# Patient Record
Sex: Male | Born: 1975 | Race: White | Hispanic: No | Marital: Single | Smoking: Current every day smoker
Health system: Southern US, Community
[De-identification: ages and names within clinical notes are randomized; demographics above are authoritative.]

## PROBLEM LIST (undated history)

## (undated) DIAGNOSIS — E119 Type 2 diabetes mellitus without complications: Secondary | ICD-10-CM

## (undated) DIAGNOSIS — R011 Cardiac murmur, unspecified: Secondary | ICD-10-CM

## (undated) DIAGNOSIS — F411 Generalized anxiety disorder: Secondary | ICD-10-CM

## (undated) DIAGNOSIS — I10 Essential (primary) hypertension: Secondary | ICD-10-CM

---

## 2017-09-25 ENCOUNTER — Encounter (HOSPITAL_COMMUNITY): Payer: Self-pay | Admitting: *Deleted

## 2017-09-25 ENCOUNTER — Ambulatory Visit (HOSPITAL_COMMUNITY)
Admission: EM | Admit: 2017-09-25 | Discharge: 2017-09-25 | Disposition: A | Discharge status: Home / Self Care | Payer: Self-pay | Attending: Internal Medicine | Admitting: Internal Medicine

## 2017-09-25 ENCOUNTER — Other Ambulatory Visit: Payer: Self-pay

## 2017-09-25 ENCOUNTER — Ambulatory Visit (INDEPENDENT_AMBULATORY_CARE_PROVIDER_SITE_OTHER): Payer: Self-pay

## 2017-09-25 DIAGNOSIS — S92355A Nondisplaced fracture of fifth metatarsal bone, left foot, initial encounter for closed fracture: Secondary | ICD-10-CM

## 2017-09-25 HISTORY — DX: Type 2 diabetes mellitus without complications: E11.9

## 2017-09-25 HISTORY — DX: Essential (primary) hypertension: I10

## 2017-09-25 NOTE — Discharge Instructions (Signed)
Activity as tolerated, use of post op shoe or firm soled shoe. Ice, elevation, ibuprofen for pain control. Follow up with a primary care provider or orthopedics for recheck in approximately 2-4 weeks.

## 2017-09-25 NOTE — ED Triage Notes (Signed)
Reports tripping and falling yesterday, causing injury to left dorsal foot.  C/O ecchymosis to area.  Ambulates with limp.  Denies pain at rest, only with ambulation.

## 2017-09-25 NOTE — ED Provider Notes (Signed)
[place]    CSN: [id number] Arrival date & time: 09/25/17  1537     History   Chief Complaint Chief Complaint  Patient presents with  . Foot Injury    HPI Bayard More is a 41 y.o. male.   Darryl Levine presents with complaints of lateral left foot pain which developed after he stepped down on the foot wrong this morning. Denies previous left foot injury. Took 819m ibuprofen this morning which helped. Pain is worse with weight bearing. Denies loss of sensation. No open skin or lacerations. Bruising present.    ROS per HPI.       Past Medical History:  Diagnosis Date  . Diabetes mellitus without complication ([place]    has been without metformin "for a while"  . Hypertension     There are no active problems to display for this patient.   History reviewed. No pertinent surgical history.     Home Medications    Prior to Admission medications   Not on File    Family History No family history on file.  Social History Social History   Tobacco Use  . Smoking status: Current Every Day Smoker  Substance Use Topics  . Alcohol use: Yes    Alcohol/week: 7.2 oz    Types: 12 Cans of beer per week  . Drug use: No     Allergies   Patient has no known allergies.   Review of Systems Review of Systems   Physical Exam Triage Vital Signs ED Triage Vitals [09/25/17 1625]  Enc Vitals Group     BP (!) 158/93     Pulse Rate 99     Resp 16     Temp 98.1 F (36.7 C)     Temp Source Oral     SpO2 100 %     Weight      Height      Head Circumference      Peak Flow      Pain Score      Pain Loc      Pain Edu?      Excl. in [place]    No data found.  Updated Vital Signs BP (!) 158/93   Pulse 99   Temp 98.1 F (36.7 C) (Oral)   Resp 16   SpO2 100%   Visual Acuity Right Eye Distance:   Left Eye Distance:   Bilateral Distance:    Right Eye Near:   Left Eye Near:    Bilateral Near:     Physical Exam  Constitutional: He is oriented to  person, place, and time. He appears well-developed and well-nourished.  Cardiovascular: Normal rate and regular rhythm.  Pulmonary/Chest: Effort normal and breath sounds normal.  Musculoskeletal:       Left foot: There is tenderness, bony tenderness and swelling. There is normal range of motion, normal capillary refill, no crepitus, no deformity and no laceration.       Feet:  Bruising and redness noted; tenderness worse to 5 metatarsal, mild tenderness to 4th metatarsal; sensation intact; full active ankle and toe ROM  Neurological: He is alert and oriented to person, place, and time.  Skin: Skin is warm and dry.     UC Treatments / Results  Labs (all labs ordered are listed, but only abnormal results are displayed) Labs Reviewed - No data to display  EKG  EKG Interpretation None       Radiology Dg Foot Complete Left  Result Date: 09/25/2017 CLINICAL DATA:  41 year old male with left foot pain, swelling and redness after oral lying foot this morning. EXAM: LEFT FOOT - COMPLETE 3+ VIEW COMPARISON:  None. FINDINGS: Nondisplaced oblique fractures involving of the midshaft of the fifth metatarsal. Remaining osseous structures appear intact there is minimal overlying soft tissue swelling. IMPRESSION: Nondisplaced oblique fractures involving the midshaft of the fifth metatarsal. Electronically Signed   By: Kristopher Oppenheim M.D.   On: 09/25/2017 18:20    Procedures Procedures (including critical care time)  Medications Ordered in UC Medications - No data to display   Initial Impression / Assessment and Plan / UC Course  I have reviewed the triage vital signs and the nursing notes.  Pertinent labs & imaging results that were available during my care of the patient were reviewed by me and considered in my medical decision making (see chart for details).     5th metatarsal non displaced shaft fractures. Post op shoe provided. 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 Recommended off for  the next approximately 5 days, may return sooner if tolerable. To follow up with orthopedics or primary care for recheck. RICE, ibuprofen for pain control. Patient verbalized understanding and agreeable to plan. Ambulatory out of clinic without difficulty.    Final Clinical Impressions(s) / UC Diagnoses   Final diagnoses:  Closed nondisplaced fracture of fifth metatarsal bone of left foot, initial encounter    ED Discharge Orders    None       Controlled Substance Prescriptions [place] Controlled Substance Registry consulted? Not Applicable   Zigmund Gottron, NP 09/25/17 [phone]

## 2017-12-18 ENCOUNTER — Ambulatory Visit (HOSPITAL_COMMUNITY)
Admission: EM | Admit: 2017-12-18 | Discharge: 2017-12-18 | Disposition: A | Discharge status: Home / Self Care | Payer: Self-pay | Attending: Family Medicine | Admitting: Family Medicine

## 2017-12-18 ENCOUNTER — Encounter (HOSPITAL_COMMUNITY): Payer: Self-pay | Admitting: Family Medicine

## 2017-12-18 DIAGNOSIS — J209 Acute bronchitis, unspecified: Secondary | ICD-10-CM

## 2017-12-18 DIAGNOSIS — H1132 Conjunctival hemorrhage, left eye: Secondary | ICD-10-CM

## 2017-12-18 MED ORDER — PREDNISONE 5 MG PO TABS: ORAL_TABLET | ORAL | 0 refills | Status: DC

## 2017-12-18 MED ORDER — BENZONATATE 100 MG PO CAPS: 200.0000 mg | ORAL_CAPSULE | Freq: Three times a day (TID) | ORAL | 0 refills | Status: DC | PRN

## 2017-12-18 MED ORDER — AZITHROMYCIN 250 MG PO TABS: 250.0000 mg | ORAL_TABLET | Freq: Every day | ORAL | 0 refills | Status: DC

## 2017-12-18 NOTE — ED Triage Notes (Addendum)
Pt here for bleeding in left eye noticed this am. denies pain or trouble with vision.

## 2017-12-18 NOTE — ED Provider Notes (Signed)
[name]   [id number] 12/18/17 Arrival Time: 1226  ASSESSMENT & PLAN:  1. Subconjunctival hemorrhage of left eye   2. Acute bronchitis, unspecified organism     Meds ordered this encounter  Medications  . azithromycin (ZITHROMAX) 250 MG tablet    Sig: Take 1 tablet (250 mg total) by mouth daily. Take first 2 tablets together, then 1 every day until finished.    Dispense:  6 tablet    Refill:  0    Order Specific Question:   Supervising Provider    Answer:   Vanessa Kick [id number]  . benzonatate (TESSALON) 100 MG capsule    Sig: Take 2 capsules (200 mg total) by mouth 3 (three) times daily as needed for cough.    Dispense:  21 capsule    Refill:  0    Order Specific Question:   Supervising Provider    Answer:   Vanessa Kick [id number]  . predniSONE (DELTASONE) 5 MG tablet    Sig: Take 6-5-4-3-2-1 po qd    Dispense:  21 tablet    Refill:  0    Order Specific Question:   Supervising Provider    Answer:   Vanessa Kick [[id number]]    Reviewed expectations re: course of current medical issues. Questions answered. Outlined signs and symptoms indicating need for more acute intervention. Patient verbalized understanding. After Visit Summary given.   SUBJECTIVE: History from: patient. Darryl Levine is a 42 y.o. male who presents with complaint of intermittent left eye redness and cough. Reports abrupt onset today. Described symptoms have gradually worsened since beginning.  ROS: As per HPI.   OBJECTIVE:  Vitals:   12/18/17 1343  BP: (!) 141/83  Pulse: (!) 102  Resp: 18  Temp: 98.1 F (36.7 C)  SpO2: 98%    General appearance: alert; no distress Eyes: PERRLA; EOMI; conjunctiva left eye with subconjunctiva hemorrage, right wnl HENT: normocephalic; atraumatic; TMs normal; nasal mucosa normal; oral mucosa normal Neck: supple  Lungs: clear to auscultation bilaterally Heart: regular rate and rhythm Abdomen: soft, non-tender; bowel sounds normal; no masses  or organomegaly; no guarding or rebound tenderness Back: no CVA tenderness Extremities: no cyanosis or edema; symmetrical with no gross deformities Skin: warm and dry Neurologic: normal gait; normal symmetric reflexes Psychological: alert and cooperative; normal mood and affect  Labs: No results found for this or any previous visit. Labs Reviewed - No data to display  Imaging: No results found.  No Known Allergies  Past Medical History:  Diagnosis Date  . Diabetes mellitus without complication ([place])    has been without metformin "for a while"  . Hypertension    Social History   Socioeconomic History  . Marital status: Single    Spouse name: Not on file  . Number of children: Not on file  . Years of education: Not on file  . Highest education level: Not on file  Social Needs  . Financial resource strain: Not on file  . Food insecurity - worry: Not on file  . Food insecurity - inability: Not on file  . Transportation needs - medical: Not on file  . Transportation needs - non-medical: Not on file  Occupational History  . Not on file  Tobacco Use  . Smoking status: Current Every Day Smoker  Substance and Sexual Activity  . Alcohol use: Yes    Alcohol/week: 7.2 oz    Types: 12 Cans of beer per week  . Drug use: No  . Sexual activity: Not on  file  Other Topics Concern  . Not on file  Social History Narrative  . Not on file   History reviewed. No pertinent family history. History reviewed. No pertinent surgical history.   Lysbeth Penner, [place] 12/18/17 2059

## 2018-03-02 ENCOUNTER — Other Ambulatory Visit: Payer: Self-pay

## 2018-03-02 ENCOUNTER — Encounter (HOSPITAL_COMMUNITY): Payer: Self-pay | Admitting: Emergency Medicine

## 2018-03-02 ENCOUNTER — Ambulatory Visit (HOSPITAL_COMMUNITY)
Admission: EM | Admit: 2018-03-02 | Discharge: 2018-03-02 | Disposition: A | Discharge status: Home / Self Care | Payer: Self-pay

## 2018-03-02 DIAGNOSIS — I10 Essential (primary) hypertension: Secondary | ICD-10-CM

## 2018-03-02 DIAGNOSIS — M545 Low back pain, unspecified: Secondary | ICD-10-CM

## 2018-03-02 DIAGNOSIS — M62830 Muscle spasm of back: Secondary | ICD-10-CM

## 2018-03-02 HISTORY — DX: Cardiac murmur, unspecified: R01.1

## 2018-03-02 MED ORDER — NAPROXEN 375 MG PO TABS: 375.0000 mg | ORAL_TABLET | Freq: Two times a day (BID) | ORAL | 0 refills | Status: DC

## 2018-03-02 NOTE — Discharge Instructions (Addendum)
Rest, ice, heat and gentle stretches Use naproxen as needed for symptomatic relief.  Discontinue ibuprofen, advil, and/or aleve while taking naproxen Work note given Follow up with PCP if symptoms persists Return or go to ER if you have any new or worsening symptoms  PCP assistance requested for further evaluation and management of hypertension, diabetes and heart murmur

## 2018-03-02 NOTE — ED Provider Notes (Signed)
[facility]    CSN: [id number] Arrival date & time: 03/02/18  1224     History   Chief Complaint Chief Complaint  Patient presents with  . Back Pain    HPI Darryl Levine is a 42 y.o. male.   Complains of back pain that began 1 days ago.  It started after lifting a couch.  He localizes the pain to the low back.  He describes the pain as intermittent and achy in character.  He has tried ibuprofen with temporary relief.  His symptoms are made worse with getting up and down.  He reports similar symptoms in the past that resolved with rest.       Past Medical History:  Diagnosis Date  . Diabetes mellitus without complication ([place])    has been without metformin "for a while"  . Hypertension   . Murmur, heart     There are no active problems to display for this patient.   History reviewed. No pertinent surgical history.     Home Medications    Prior to Admission medications   Medication Sig Start Date End Date Taking? Authorizing Provider  ibuprofen (ADVIL,MOTRIN) 200 MG tablet Take 200 mg by mouth every 6 (six) hours as needed.   Yes [provider]  azithromycin (ZITHROMAX) 250 MG tablet Take 1 tablet (250 mg total) by mouth daily. Take first 2 tablets together, then 1 every day until finished. 12/18/17   Lysbeth Penner, FNP  benzonatate (TESSALON) 100 MG capsule Take 2 capsules (200 mg total) by mouth 3 (three) times daily as needed for cough. 12/18/17   Lysbeth Penner, FNP  naproxen (NAPROSYN) 375 MG tablet Take 1 tablet (375 mg total) by mouth 2 (two) times daily. 03/02/18   Macayla Ekdahl, [place], PA-C  predniSONE (DELTASONE) 5 MG tablet Take 6-5-4-3-2-1 po qd 12/18/17   Lysbeth Penner, FNP    Family History Family History  Problem Relation Age of Onset  . Diabetes Mother   . Diabetes Father     Social History Social History   Tobacco Use  . Smoking status: Current Every Day Smoker  Substance Use Topics  . Alcohol use: Yes   Alcohol/week: 7.2 oz    Types: 12 Cans of beer per week  . Drug use: No     Allergies   Patient has no known allergies.   Review of Systems Review of Systems  Constitutional: Negative for chills and fever.  Respiratory: Negative for shortness of breath.   Cardiovascular: Negative for chest pain.  Gastrointestinal: Negative for abdominal pain, nausea and vomiting.  Genitourinary:       Denies changes in bowel or bladder habits or incontinence  Neurological: Negative for weakness and numbness.     Physical Exam Triage Vital Signs ED Triage Vitals  Enc Vitals Group     BP 03/02/18 1313 (!) 151/90     Pulse Rate 03/02/18 1313 (!) 112     Resp 03/02/18 1313 18     Temp 03/02/18 1313 98.7 F (37.1 C)     Temp Source 03/02/18 1313 Oral     SpO2 03/02/18 1313 99 %     Weight --      Height --      Head Circumference --      Peak Flow --      Pain Score 03/02/18 1310 7     Pain Loc --      Pain Edu? --      Excl.  in [place]? --    No data found.  Updated Vital Signs BP (!) 151/90 (BP Location: Left Arm)   Pulse (!) 112   Temp 98.7 F (37.1 C) (Oral)   Resp 18   SpO2 99%    Physical Exam  Constitutional: He is oriented to person, place, and time. He appears well-developed and well-nourished. No distress.  HENT:  Head: Normocephalic and atraumatic.  Right Ear: External ear normal.  Left Ear: External ear normal.  Nose: Nose normal.  Mouth/Throat: Oropharynx is clear and moist. No oropharyngeal exudate.  Eyes: Pupils are equal, round, and reactive to light. EOM are normal.  Neck: Normal range of motion. Neck supple.  Cardiovascular: Regular rhythm. Exam reveals no gallop and no friction rub.  Murmur (Grade 2-3 systolic murmur appreciated in the aortic region) heard. Radial pulse 2+ bilaterally    Pulmonary/Chest: Effort normal and breath sounds normal. No stridor. No respiratory distress. He has no wheezes. He has no rales.  Abdominal: Soft. Bowel sounds are normal.  There is no tenderness. There is no guarding.  Musculoskeletal:  Back:  Patient ambulates from chair to exam table without difficulty.  Inspection: Skin clear and intact without obvious swelling, erythema, or ecchymosis. Warm to the touch  Palpation: Vertebral processes nontender. Tenderness about the lower left and right paravertebral muscles  ROM: FROM Strength: 5/5 hip flexion, 5/5 knee extension, 5/5 knee flexion, 5/5 plantar flexion, 5/5 dorsiflexion  DTR: Patellar tendon reflex intact  Special Tests: Negative Straight leg raise  Lymphadenopathy:    He has no cervical adenopathy.  Neurological: He is alert and oriented to person, place, and time.  Skin: Skin is warm. Capillary refill takes less than 2 seconds.  Hands clammy, but patient reports he "get(s) nervous at the doctor's office."  Psychiatric: Judgment and thought content normal.  Appears anxious and nervous during examination     UC Treatments / Results  Labs (all labs ordered are listed, but only abnormal results are displayed) Labs Reviewed - No data to display  EKG None  Radiology No results found.  Procedures Procedures (including critical care time)  Medications Ordered in UC Medications - No data to display  Initial Impression / Assessment and Plan / UC Course  I have reviewed the triage vital signs and the nursing notes.  Pertinent labs & imaging results that were available during my care of the patient were reviewed by me and considered in my medical decision making (see chart for details).     Patient complains of low back pain.  PE consistent with back spasm.  Prescribed naproxen.  Instructed patient to rest, ice, heat and gentle stretches.  Will return or follow up with PCP if symptoms persists.  New or worsening symptoms will return or go to ER.   Final Clinical Impressions(s) / UC Diagnoses   Final diagnoses:  Acute bilateral low back pain without sciatica  Spasm of muscle of lower back    Benign essential HTN     Discharge Instructions     Rest, ice, heat and gentle stretches Use naproxen as needed for symptomatic relief.  Discontinue ibuprofen, advil, and/or aleve while taking naproxen Work note given Follow up with PCP if symptoms persists Return or go to ER if you have any new or worsening symptoms  PCP assistance requested for further evaluation and management of hypertension, diabetes and heart murmur    ED Prescriptions    Medication Sig Dispense Auth. Provider   naproxen (NAPROSYN) 375 MG tablet  Take 1 tablet (375 mg total) by mouth 2 (two) times daily. 20 tablet Stacey Drain, [place], PA-C     Controlled Substance Prescriptions [place] Controlled Substance Registry consulted? Not Applicable   Lestine Box, [place] 03/02/18 1357

## 2018-03-02 NOTE — ED Triage Notes (Signed)
Lower back pain since moving a couch yesterday.    "I need a note for work today"

## 2018-04-21 ENCOUNTER — Other Ambulatory Visit: Payer: Self-pay

## 2018-04-21 ENCOUNTER — Encounter (HOSPITAL_COMMUNITY): Payer: Self-pay | Admitting: Emergency Medicine

## 2018-04-21 ENCOUNTER — Ambulatory Visit (HOSPITAL_COMMUNITY)
Admission: EM | Admit: 2018-04-21 | Discharge: 2018-04-21 | Disposition: A | Discharge status: Home / Self Care | Payer: Self-pay | Attending: Nurse Practitioner | Admitting: Nurse Practitioner

## 2018-04-21 DIAGNOSIS — R42 Dizziness and giddiness: Secondary | ICD-10-CM | POA: Insufficient documentation

## 2018-04-21 DIAGNOSIS — I10 Essential (primary) hypertension: Secondary | ICD-10-CM | POA: Insufficient documentation

## 2018-04-21 DIAGNOSIS — R011 Cardiac murmur, unspecified: Secondary | ICD-10-CM | POA: Insufficient documentation

## 2018-04-21 DIAGNOSIS — E118 Type 2 diabetes mellitus with unspecified complications: Secondary | ICD-10-CM | POA: Insufficient documentation

## 2018-04-21 DIAGNOSIS — Z7984 Long term (current) use of oral hypoglycemic drugs: Secondary | ICD-10-CM | POA: Insufficient documentation

## 2018-04-21 DIAGNOSIS — F17200 Nicotine dependence, unspecified, uncomplicated: Secondary | ICD-10-CM | POA: Insufficient documentation

## 2018-04-21 LAB — POCT URINALYSIS DIP (DEVICE)
BILIRUBIN URINE: NEGATIVE
Glucose, UA: NEGATIVE mg/dL
HGB URINE DIPSTICK: NEGATIVE
KETONES UR: NEGATIVE mg/dL
LEUKOCYTES UA: NEGATIVE
Nitrite: NEGATIVE
PH: 5.5 (ref 5.0–8.0)
Protein, ur: NEGATIVE mg/dL
SPECIFIC GRAVITY, URINE: 1.015 (ref 1.005–1.030)
Urobilinogen, UA: 0.2 mg/dL (ref 0.0–1.0)

## 2018-04-21 LAB — HEMOGLOBIN A1C
Hgb A1c MFr Bld: 6 % — ABNORMAL HIGH (ref 4.8–5.6)
Mean Plasma Glucose: 125.5 mg/dL

## 2018-04-21 LAB — GLUCOSE, CAPILLARY: Glucose-Capillary: 110 mg/dL — ABNORMAL HIGH (ref 65–99)

## 2018-04-21 MED ORDER — METFORMIN HCL 500 MG PO TABS: 500.0000 mg | ORAL_TABLET | Freq: Two times a day (BID) | ORAL | 0 refills | Status: AC

## 2018-04-21 NOTE — ED Triage Notes (Addendum)
Yesterday cbg 350.  Unable to check it today, ran out of test strips.  Patient does not have medication, no insurance.    Patient complains of feeling lightheaded  Boss told him to get sugar checked.

## 2018-04-21 NOTE — ED Provider Notes (Signed)
[facility]    CSN: [id number] Arrival date & time: 04/21/18  1232     History   Chief Complaint Chief Complaint  Patient presents with  . Dizziness    HPI Darryl Levine is a 42 y.o. male.   Subjective:  Darryl Levine is a 42 y.o. male who is here for evaluation of dizziness. The dizziness has been present for 1 day. He describes the symptoms as lightheadedness. Symptoms are exacerbated by none identified. He denies otalgia, tinnitus, chest pain, shortness of breath, limb paraesthesias, vision changes, headache, nausea or vomiting. Notably, the patient has a history of diabetes. He was prescribed metformin but reports being off this medication for at least 6 months due to lack of insurance. He checked his blood sugar yesterday and reports it being 350. He has not checked his blood sugar again because he ran out of testing strips. Patient reports increased urination and thirst lately as well.   The following portions of the patient's history were reviewed and updated as appropriate: allergies, current medications, past family history, past medical history, past social history, past surgical history and problem list.       Past Medical History:  Diagnosis Date  . Diabetes mellitus without complication ([place])    has been without metformin "for a while"  . Hypertension   . Murmur, heart     There are no active problems to display for this patient.   History reviewed. No pertinent surgical history.     Home Medications    Prior to Admission medications   Medication Sig Start Date End Date Taking? Authorizing Provider  azithromycin (ZITHROMAX) 250 MG tablet Take 1 tablet (250 mg total) by mouth daily. Take first 2 tablets together, then 1 every day until finished. 12/18/17   Lysbeth Penner, FNP  benzonatate (TESSALON) 100 MG capsule Take 2 capsules (200 mg total) by mouth 3 (three) times daily as needed for cough. 12/18/17   Lysbeth Penner, FNP  ibuprofen  (ADVIL,MOTRIN) 200 MG tablet Take 200 mg by mouth every 6 (six) hours as needed.    [provider]  naproxen (NAPROSYN) 375 MG tablet Take 1 tablet (375 mg total) by mouth 2 (two) times daily. 03/02/18   [place], [place], PA-C  predniSONE (DELTASONE) 5 MG tablet Take 6-5-4-3-2-1 po qd 12/18/17   Lysbeth Penner, FNP    Family History Family History  Problem Relation Age of Onset  . Diabetes Mother   . Diabetes Father     Social History Social History   Tobacco Use  . Smoking status: Current Every Day Smoker  Substance Use Topics  . Alcohol use: Yes    Alcohol/week: 7.2 oz    Types: 12 Cans of beer per week  . Drug use: No     Allergies   Patient has no known allergies.   Review of Systems Review of Systems  Constitutional: Negative for chills, fever and unexpected weight change.  Respiratory: Negative for shortness of breath and wheezing.   Cardiovascular: Negative for chest pain.  Gastrointestinal: Negative for nausea and vomiting.  Endocrine: Positive for polydipsia and polyuria.  Genitourinary: Negative for dysuria and frequency.  Neurological: Positive for dizziness. Negative for weakness and headaches.     Physical Exam Triage Vital Signs ED Triage Vitals  Enc Vitals Group     BP 04/21/18 1249 (!) 155/85     Pulse Rate 04/21/18 1249 97     Resp 04/21/18 1249 18  Temp 04/21/18 1249 98.3 F (36.8 C)     Temp Source 04/21/18 1249 Oral     SpO2 04/21/18 1249 97 %     Weight --      Height --      Head Circumference --      Peak Flow --      Pain Score 04/21/18 1247 0     Pain Loc --      Pain Edu? --      Excl. in [place]? --    No data found.  Updated Vital Signs BP (!) 155/85 (BP Location: Left Arm)   Pulse 97   Temp 98.3 F (36.8 C) (Oral)   Resp 18   SpO2 97%   Visual Acuity Right Eye Distance:   Left Eye Distance:   Bilateral Distance:    Right Eye Near:   Left Eye Near:    Bilateral Near:     Physical Exam  Constitutional:  He is oriented to person, place, and time. He appears well-developed and well-nourished.  Neck: Normal range of motion. Neck supple.  Cardiovascular: Normal rate and regular rhythm.  Pulmonary/Chest: Effort normal and breath sounds normal.  Musculoskeletal: Normal range of motion.  Neurological: He is alert and oriented to person, place, and time. He has normal strength and normal reflexes. No cranial nerve deficit or sensory deficit. Coordination and gait normal.  Skin: Skin is warm and dry.  Psychiatric: He has a normal mood and affect.     UC Treatments / Results  Labs (all labs ordered are listed, but only abnormal results are displayed) Labs Reviewed  HEMOGLOBIN A1C    EKG None  Radiology No results found.  Procedures Procedures (including critical care time)  Medications Ordered in UC Medications - No data to display  Initial Impression / Assessment and Plan / UC Course  I have reviewed the triage vital signs and the nursing notes.  Pertinent labs & imaging results that were available during my care of the patient were reviewed by me and considered in my medical decision making (see chart for details).    42 yo male with history of type II diabetes that presents with dizziness, polyuria and polydipsia. He has been off his metformin for at least the past 6 months. He has had elevated sugars of up to 350 at home.   Patient AAOx3. Afebrile. VSS. Physical exam unremarkable. No focal neurological deficits noted. CBG 110. Urinalysis negative for glucose or ketones.    Plan: - Start Metformin 500 mg PO BID x 30 days  - Check glucose at least twice daily. Log results. Take log to PCP.  - Hemoglobin A1c 6.0  - Follow up with [facility] in 1 week. Call today to make an appointment.  - Discussed indication for immediate ED follow-up   Today's evaluation has revealed no signs of a dangerous process. Discussed diagnosis with patient. Patient aware of their  diagnosis, possible red flag symptoms to watch out for and need for close follow up. Patient understands verbal and written discharge instructions. Patient comfortable with plan and disposition.  Patient has a clear mental status at this time, good insight into illness (after discussion and teaching) and has clear judgment to make decisions regarding their care.  Documentation was completed with the aid of voice recognition software. Transcription may contain typographical errors.   Final Clinical Impressions(s) / UC Diagnoses   Final diagnoses:  Dizziness  Type 2 diabetes mellitus with complication, without long-term  current use of insulin [facility] [place] [facility])   Discharge Instructions   None    ED Prescriptions    None     Controlled Substance Prescriptions [place] Controlled Substance Registry consulted? Not Applicable   Enrique Sack, [place] 04/21/18 1401

## 2018-04-21 NOTE — Discharge Instructions (Addendum)
Check your blood sugar at least twice a day and log the results. Take the log with you to your follow-up appointment  Take Metformin twice daily with food as prescribed Hemoglobin A1C was 6.0 Report to ED immediately for any significant weight loss, excessive thirst, vision changes, confusion, vomiting, rapid breathing, chest pain, shortness of breath, abdominal pain, elevated sugars or any other concerning symptom Follow-up with [facility] in 1 week

## 2018-04-24 ENCOUNTER — Other Ambulatory Visit: Payer: Self-pay

## 2018-04-24 ENCOUNTER — Encounter (HOSPITAL_COMMUNITY): Payer: Self-pay | Admitting: Emergency Medicine

## 2018-04-24 ENCOUNTER — Ambulatory Visit (HOSPITAL_COMMUNITY)
Admission: EM | Admit: 2018-04-24 | Discharge: 2018-04-24 | Disposition: A | Discharge status: Home / Self Care | Payer: Self-pay | Attending: Family Medicine | Admitting: Family Medicine

## 2018-04-24 DIAGNOSIS — K047 Periapical abscess without sinus: Secondary | ICD-10-CM

## 2018-04-24 MED ORDER — AMOXICILLIN 875 MG PO TABS: 875.0000 mg | ORAL_TABLET | Freq: Two times a day (BID) | ORAL | 0 refills | Status: DC

## 2018-04-24 MED ORDER — CEFTRIAXONE SODIUM 1 G IJ SOLR
INTRAMUSCULAR | Status: AC
  Filled 2018-04-24: qty 10

## 2018-04-24 MED ORDER — CEFTRIAXONE SODIUM 1 G IJ SOLR
1.0000 g | Freq: Once | INTRAMUSCULAR | Status: AC
  Administered 2018-04-24: 1 g via INTRAMUSCULAR

## 2018-04-24 MED ORDER — LIDOCAINE HCL (PF) 2 % IJ SOLN
INTRAMUSCULAR | Status: AC
  Filled 2018-04-24: qty 2

## 2018-04-24 NOTE — Discharge Instructions (Addendum)
Get the amoxicillin as soon as you can  Keep your dental appointment

## 2018-04-24 NOTE — ED Provider Notes (Addendum)
[place]   [id number] 04/24/18 Arrival Time: 1749   SUBJECTIVE:  Darryl Levine is a 41 y.o. male who presents to the urgent care with complaint of right side face swelling that started this morning and is progressively worsening.  He has a known problem with tooth #28 and has a dental appt for July 3th  He works at [employer]   Past Medical History:  Diagnosis Date  . Diabetes mellitus without complication ([place])    has been without metformin "for a while"  . Hypertension   . Murmur, heart    Family History  Problem Relation Age of Onset  . Diabetes Mother   . Diabetes Father    Social History   Socioeconomic History  . Marital status: Single    Spouse name: Not on file  . Number of children: Not on file  . Years of education: Not on file  . Highest education level: Not on file  Occupational History  . Not on file  Social Needs  . Financial resource strain: Not on file  . Food insecurity:    Worry: Not on file    Inability: Not on file  . Transportation needs:    Medical: Not on file    Non-medical: Not on file  Tobacco Use  . Smoking status: Current Every Day Smoker  Substance and Sexual Activity  . Alcohol use: Yes    Alcohol/week: 7.2 oz    Types: 12 Cans of beer per week  . Drug use: No  . Sexual activity: Not on file  Lifestyle  . Physical activity:    Days per week: Not on file    Minutes per session: Not on file  . Stress: Not on file  Relationships  . Social connections:    Talks on phone: Not on file    Gets together: Not on file    Attends religious service: Not on file    Active member of club or organization: Not on file    Attends meetings of clubs or organizations: Not on file    Relationship status: Not on file  . Intimate partner violence:    Fear of current or ex partner: Not on file    Emotionally abused: Not on file    Physically abused: Not on file    Forced sexual activity: Not on file  Other  Topics Concern  . Not on file  Social History Narrative  . Not on file   Current Meds  Medication Sig  . metFORMIN (GLUCOPHAGE) 500 MG tablet Take 1 tablet (500 mg total) by mouth 2 (two) times daily with a meal.   No Known Allergies    ROS: As per HPI, remainder of ROS negative.   OBJECTIVE:   Vitals:   04/24/18 1757  BP: (!) 160/87  Pulse: (!) 101  Resp: 18  Temp: 99.5 F (37.5 C)  TempSrc: Oral  SpO2: 100%     General appearance: alert; no distress Eyes: PERRL; EOMI; conjunctiva normal HENT: right cheek swelling; atraumatic;  oral mucosa swollen at tooth #28 Neck: supple; no adenopathy Back: no CVA tenderness Extremities: no cyanosis or edema; symmetrical with no gross deformities Skin: warm and dry Neurologic: normal gait; grossly normal Psychological: alert and cooperative; normal mood and affect      Labs:  Results for orders placed or performed during the hospital encounter of 04/21/18  Hemoglobin A1c  Result Value Ref Range   Hgb A1c MFr Bld 6.0 (H) 4.8 -  5.6 %   Mean Plasma Glucose 125.5 mg/dL  Glucose, capillary  Result Value Ref Range   Glucose-Capillary 110 (H) 65 - 99 mg/dL  POCT urinalysis dip (device)  Result Value Ref Range   Glucose, UA NEGATIVE NEGATIVE mg/dL   Bilirubin Urine NEGATIVE NEGATIVE   Ketones, ur NEGATIVE NEGATIVE mg/dL   Specific Gravity, Urine 1.015 1.005 - 1.030   Hgb urine dipstick NEGATIVE NEGATIVE   pH 5.5 5.0 - 8.0   Protein, ur NEGATIVE NEGATIVE mg/dL   Urobilinogen, UA 0.2 0.0 - 1.0 mg/dL   Nitrite NEGATIVE NEGATIVE   Leukocytes, UA NEGATIVE NEGATIVE    Labs Reviewed - No data to display  No results found.     ASSESSMENT & PLAN:  No diagnosis found.  No orders of the defined types were placed in this encounter.   Reviewed expectations re: course of current medical issues. Questions answered. Outlined signs and symptoms indicating need for more acute intervention. Patient verbalized  understanding. After Visit Summary given.    Procedures:      Robyn Haber, MD 04/24/18 Juanna Cao, MD 04/24/18 Bosie Helper

## 2018-04-24 NOTE — ED Triage Notes (Signed)
The patient presented to the [facility] with a complaint of dental pain and facial swelling that started this am.

## 2018-05-16 ENCOUNTER — Ambulatory Visit (HOSPITAL_COMMUNITY)
Admission: EM | Admit: 2018-05-16 | Discharge: 2018-05-16 | Disposition: A | Discharge status: Home / Self Care | Payer: Self-pay | Attending: Family Medicine | Admitting: Family Medicine

## 2018-05-16 ENCOUNTER — Encounter (HOSPITAL_COMMUNITY): Payer: Self-pay | Admitting: Emergency Medicine

## 2018-05-16 DIAGNOSIS — K047 Periapical abscess without sinus: Secondary | ICD-10-CM

## 2018-05-16 MED ORDER — PENICILLIN V POTASSIUM 500 MG PO TABS: 500.0000 mg | ORAL_TABLET | Freq: Four times a day (QID) | ORAL | 0 refills | Status: AC

## 2018-05-16 NOTE — Discharge Instructions (Addendum)
It was nice meeting you!!  I am giving you a prescription for penicillin for the dental infection.  No antibiotic shot needed today.  You may take tylenol and motrin for pain.  Please follow up with a dentist as soon as possible.

## 2018-05-16 NOTE — ED Triage Notes (Signed)
Pt c/o dental pain and facial swelling, been seen before for same.

## 2018-05-16 NOTE — ED Provider Notes (Signed)
[place]    CSN: [id number] Arrival date & time: 05/16/18  1242     History   Chief Complaint Chief Complaint  Patient presents with  . Dental Pain  . Facial Swelling    HPI Darryl Levine is a 42 y.o. male.   Patient is a 42 year old male with medical history of diabetes and hypertension.  He presents today with 2 days of right-sided facial swelling and dental pain.  The problem has been constant and worsening.  He denies any fever, chills, body aches, nausea, vomiting.  He was seen on 04/24/2018 for the same problem.  At that time he was giving a Rocephin injection and a prescription for amoxicillin.  He reports the problem did improve but he was unable to see a dentist.   He is a smoker.  ROS per HPI        Past Medical History:  Diagnosis Date  . Diabetes mellitus without complication ([place])    has been without metformin "for a while"  . Hypertension   . Murmur, heart     There are no active problems to display for this patient.   History reviewed. No pertinent surgical history.     Home Medications    Prior to Admission medications   Medication Sig Start Date End Date Taking? Authorizing Provider  amoxicillin (AMOXIL) 875 MG tablet Take 1 tablet (875 mg total) by mouth 2 (two) times daily. Patient not taking: Reported on 05/16/2018 04/24/18   Robyn Haber, MD  metFORMIN (GLUCOPHAGE) 500 MG tablet Take 1 tablet (500 mg total) by mouth 2 (two) times daily with a meal. 04/21/18 05/21/18  Enrique Sack, FNP  penicillin v potassium (VEETID) 500 MG tablet Take 1 tablet (500 mg total) by mouth 4 (four) times daily for 7 days. 05/16/18 05/23/18  Orvan July, NP    Family History Family History  Problem Relation Age of Onset  . Diabetes Mother   . Diabetes Father     Social History Social History   Tobacco Use  . Smoking status: Current Every Day Smoker  Substance Use Topics  . Alcohol use: Yes    Alcohol/week: 7.2 oz    Types: 12  Cans of beer per week  . Drug use: No     Allergies   Patient has no known allergies.   Review of Systems Review of Systems   Physical Exam Triage Vital Signs ED Triage Vitals [05/16/18 1253]  Enc Vitals Group     BP (!) 153/87     Pulse Rate 79     Resp 16     Temp 98.4 F (36.9 C)     Temp src      SpO2 100 %     Weight      Height      Head Circumference      Peak Flow      Pain Score 5     Pain Loc      Pain Edu?      Excl. in [place]?    No data found.  Updated Vital Signs BP (!) 153/87   Pulse 79   Temp 98.4 F (36.9 C)   Resp 16   SpO2 100%   Visual Acuity Right Eye Distance:   Left Eye Distance:   Bilateral Distance:    Right Eye Near:   Left Eye Near:    Bilateral Near:     Physical Exam  Constitutional: He is oriented to person, place,  and time. He appears well-developed and well-nourished.  HENT:  Head: Normocephalic and atraumatic.  Mild swelling to right upper facial area. Mild erythema and swelling to right upper gum.   Neck: Normal range of motion. Neck supple.  Pulmonary/Chest: Effort normal.  Lymphadenopathy:    He has no cervical adenopathy.  Neurological: He is alert and oriented to person, place, and time.  Skin: Skin is warm and dry.  Psychiatric: He has a normal mood and affect.     UC Treatments / Results  Labs (all labs ordered are listed, but only abnormal results are displayed) Labs Reviewed - No data to display  EKG None  Radiology No results found.  Procedures Procedures (including critical care time)  Medications Ordered in UC Medications - No data to display  Initial Impression / Assessment and Plan / UC Course  I have reviewed the triage vital signs and the nursing notes.  Pertinent labs & imaging results that were available during my care of the patient were reviewed by me and considered in my medical decision making (see chart for details).   Penicillin to treat the dental infection. Tylenol and  motrin for pain relief.  Pt aware that this problem will not improve until he sees a dentist.   Final Clinical Impressions(s) / UC Diagnoses   Final diagnoses:  Dental abscess     Discharge Instructions     It was nice meeting you!!  I am giving you a prescription for penicillin for the dental infection.  No antibiotic shot needed today.  You may take tylenol and motrin for pain.  Please follow up with a dentist as soon as possible.     ED Prescriptions    Medication Sig Dispense Auth. Provider   penicillin v potassium (VEETID) 500 MG tablet Take 1 tablet (500 mg total) by mouth 4 (four) times daily for 7 days. 28 tablet Loura Halt A, NP     Controlled Substance Prescriptions [place] Controlled Substance Registry consulted? Not Applicable   Orvan July, NP 05/16/18 1431

## 2018-06-04 IMAGING — DX DG FOOT COMPLETE 3+V*L*
3 series · 3 of 3 positions shown · non-contrast
Comparison: None.

CLINICAL DATA: 41-year-old male with left foot pain, swelling and
redness after oral lying foot this morning.

EXAM:
LEFT FOOT - COMPLETE 3+ VIEW

[foot ap]
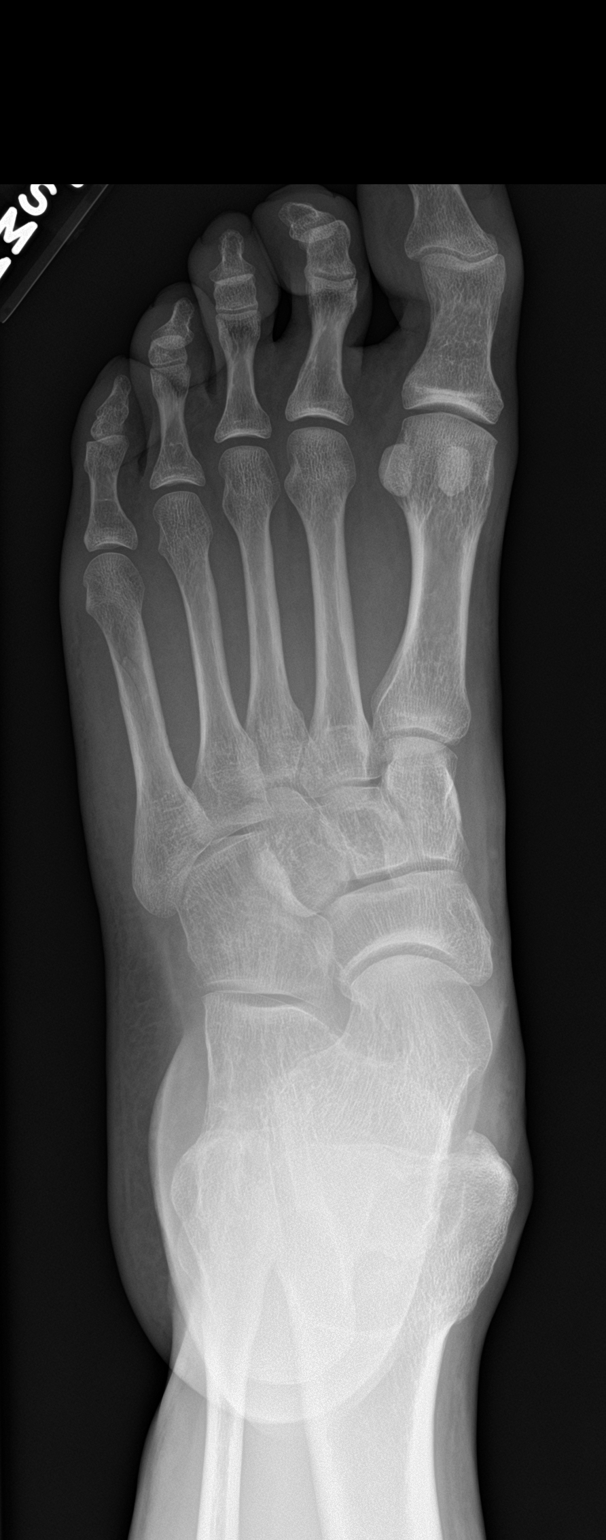

[foot obl]
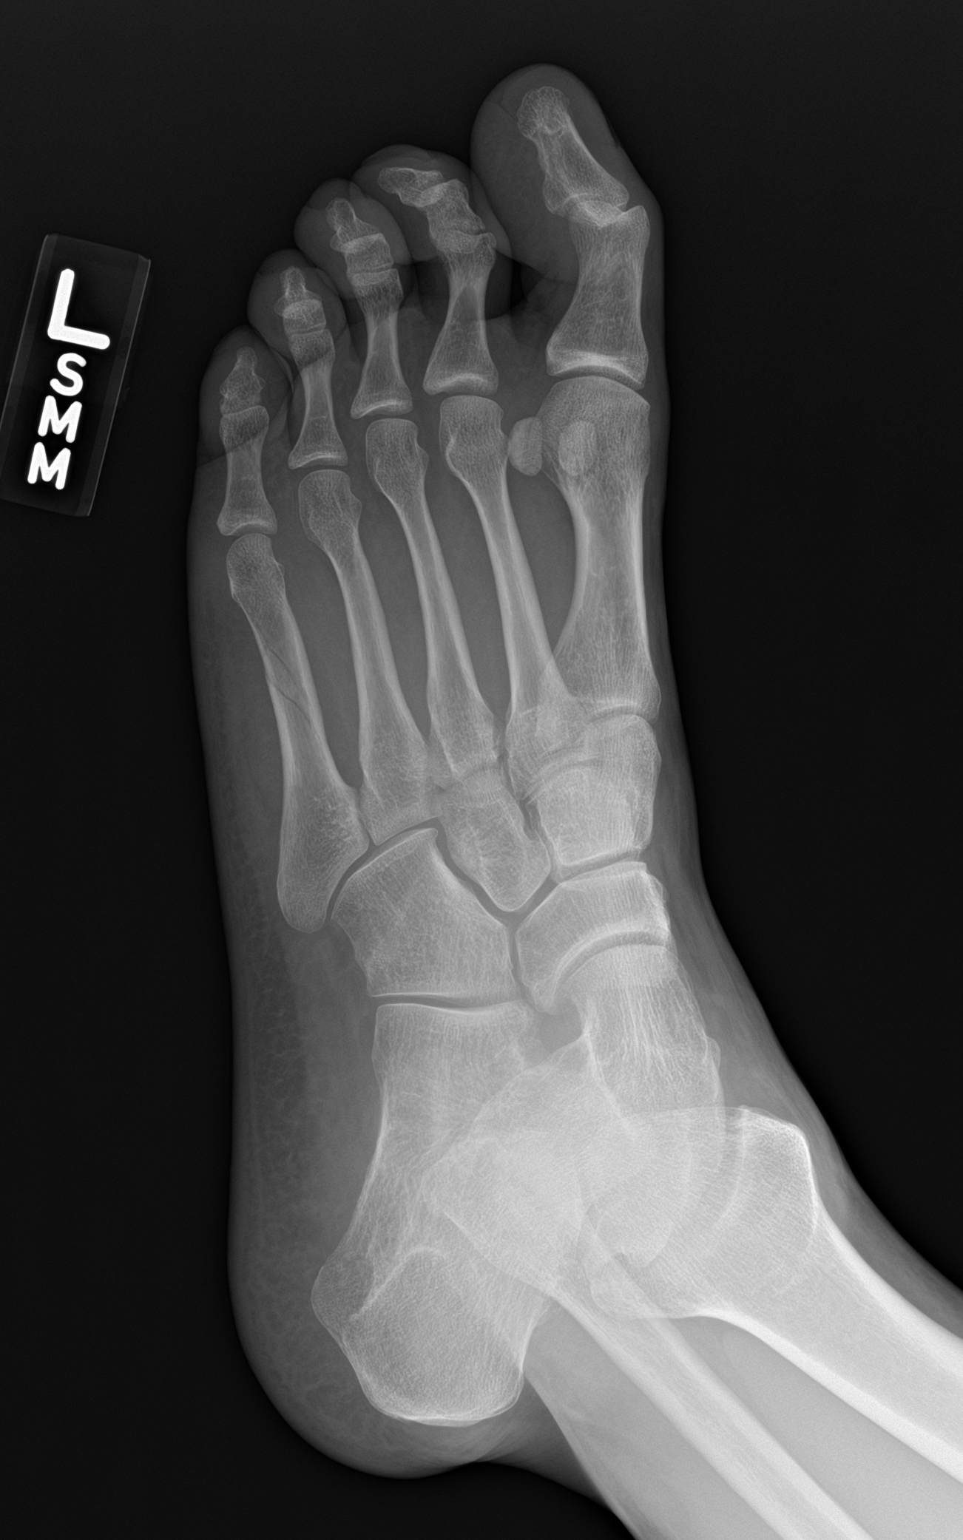

[foot lat]
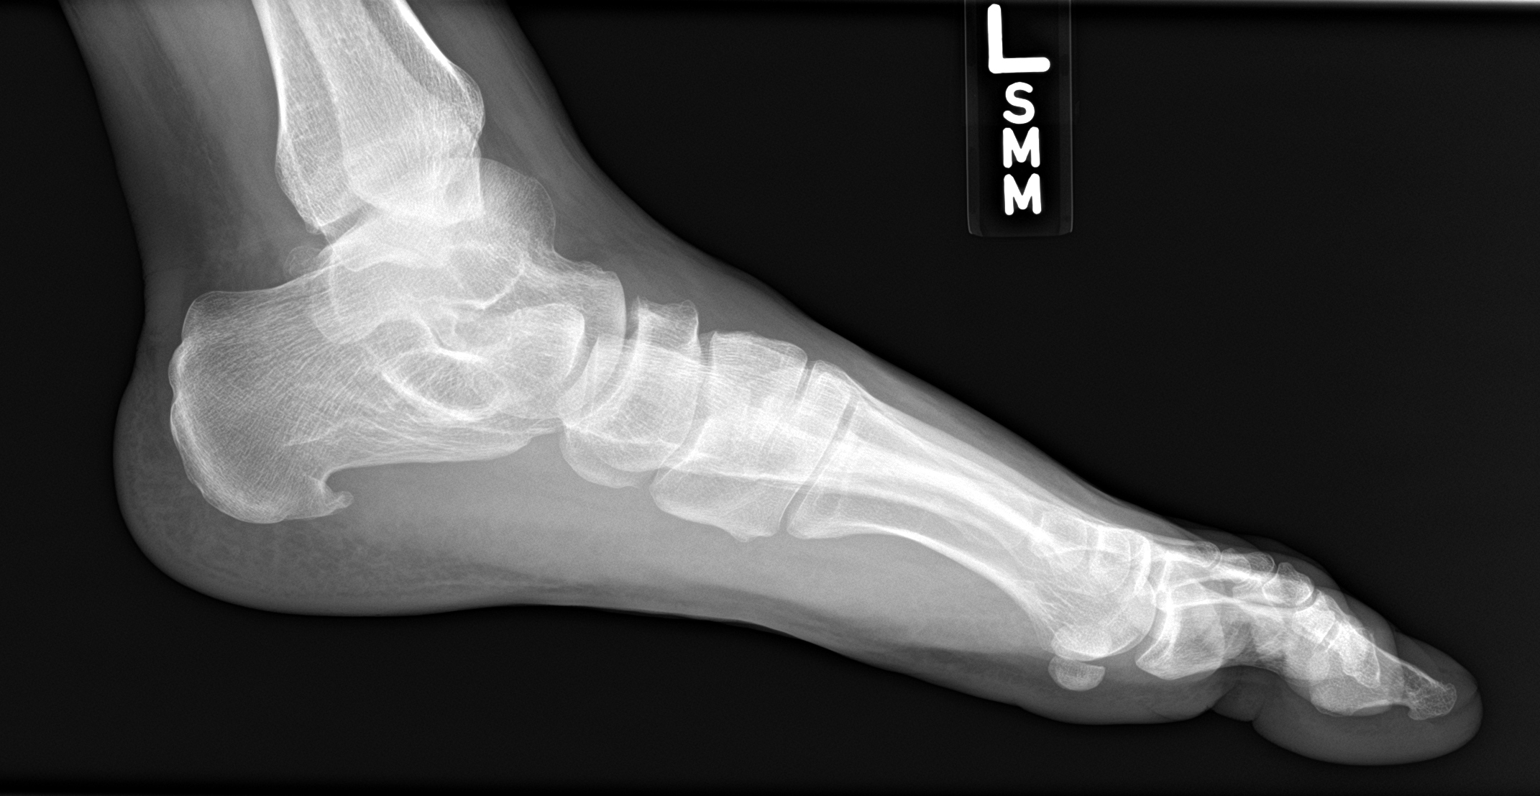

[3 of 3 positions shown; findings below may reference images not displayed]

FINDINGS: Nondisplaced oblique fractures involving of the midshaft of the
fifth metatarsal. Remaining osseous structures appear intact there
is minimal overlying soft tissue swelling.
IMPRESSION: Nondisplaced oblique fractures involving the midshaft of the fifth
metatarsal.

## 2022-03-17 LAB — EXTERNAL GENERIC LAB PROCEDURE: COLOGUARD: NEGATIVE

## 2023-12-17 ENCOUNTER — Encounter: Payer: Self-pay | Admitting: Emergency Medicine

## 2023-12-17 ENCOUNTER — Ambulatory Visit
Admission: EM | Admit: 2023-12-17 | Discharge: 2023-12-17 | Disposition: A | Discharge status: Home / Self Care | Payer: MEDICAID

## 2023-12-17 DIAGNOSIS — M62830 Muscle spasm of back: Secondary | ICD-10-CM | POA: Diagnosis not present

## 2023-12-17 HISTORY — DX: Generalized anxiety disorder: F41.1

## 2023-12-17 MED ORDER — BACLOFEN 10 MG PO TABS: 10.0000 mg | ORAL_TABLET | Freq: Every day | ORAL | 0 refills | Status: AC

## 2023-12-17 NOTE — Discharge Instructions (Signed)
The mainstay of therapy for musculoskeletal pain is reduction of inflammation and relaxation of tension which is causing inflammation.  Keep in mind, pain always begets more pain.  To help you stay ahead of your pain and inflammation, I have provided the following regimen for you:   This evening, you can begin taking baclofen 10 mg.  This is a highly effective muscle relaxer and antispasmodic which should continue to provide you with relaxation of your tense muscles, allow you to sleep well and to keep your pain under control.  You can continue taking this medication 3 times daily as you need to.  If you find that this medication makes you too sleepy, you can break them in half for your daytime doses and, if needed double them for your nighttime dose.  Do not take more than 30 mg of baclofen in a 24-hour period.   During the day, please set aside time to apply heat and ice to the affected area at least 4 times daily alternating each for 20 minutes.   Please consider discussing referral to physical therapy with your primary care provider.  Physical therapist are very good at teasing out the underlying cause of acute musculoskeletal pain and helping with prevention of future recurrences.   Please avoid attempts to stretch or strengthen the affected area until you are feeling completely pain-free.  Attempts to do so will only prolong the healing process.   I also recommend that you remain out of work for the next several days, I provided you with a note to return to work in 3 days.  If you feel that you need this time extended, please follow-up with your primary care provider or return to urgent care for reevaluation so that we can provide you with a note for another 3 days.   Thank you for visiting [facility] today.  We appreciate the opportunity to participate in your care.

## 2023-12-17 NOTE — ED Triage Notes (Signed)
Pt reports that Wed after work he went to store and got large case of water. Reports pain got worse over the night. Tried taking Ibuprofen 800 mg.

## 2023-12-17 NOTE — ED Provider Notes (Signed)
[facility]    CSN: [id number] Arrival date & time: 12/17/23  1539    HISTORY   Chief Complaint  Patient presents with   Back Pain   HPI Darryl Levine is a pleasant, 48 y.o. male who presents to urgent care today. Patient complains of back pain after carrying a large case of water 2 days ago.  Patient states the pain got worse last night.  Patient states he tried taking ibuprofen 800 mg which did not resolve the pain.  States he has had similar pain in the past but usually resolves on its own.  The history is provided by the patient.   Past Medical History:  Diagnosis Date   Diabetes mellitus without complication ([facility])    has been without metformin "for a while"   GAD (generalized anxiety disorder)    Hypertension    Murmur, heart    There are no active problems to display for this patient.  History reviewed. No pertinent surgical history.  Home Medications    Prior to Admission medications   Medication Sig Start Date End Date Taking? Authorizing Provider  atorvastatin (LIPITOR) 10 MG tablet Take by mouth. 02/24/22  Yes [provider]  cyanocobalamin (VITAMIN B12) 1000 MCG tablet Take 1 tablet by mouth daily. 01/03/20  Yes [provider]  escitalopram (LEXAPRO) 20 MG tablet Take 1 tablet by mouth daily. 11/20/22  Yes [provider]  gabapentin (NEURONTIN) 800 MG tablet Take by mouth. 11/19/23 11/18/24 Yes [provider]  lisinopril (ZESTRIL) 5 MG tablet Take 1 tablet by mouth daily. 12/14/22  Yes [provider]  naltrexone (DEPADE) 50 MG tablet Take 1 tablet by mouth daily. 03/17/22  Yes [provider]  amoxicillin (AMOXIL) 875 MG tablet Take 1 tablet (875 mg total) by mouth 2 (two) times daily. Patient not taking: Reported on 05/16/2018 04/24/18   Elvina Sidle, MD  metFORMIN (GLUCOPHAGE) 500 MG tablet Take 1 tablet (500 mg total) by mouth 2 (two) times daily with a meal. 04/21/18 05/21/18  Lurline Idol, FNP     Family History Family History  Problem Relation Age of Onset   Diabetes Mother    Diabetes Father    Social History Social History   Tobacco Use   Smoking status: Every Day  Substance Use Topics   Alcohol use: Yes    Alcohol/week: 12.0 standard drinks of alcohol    Types: 12 Cans of beer per week   Drug use: No   Allergies   Patient has no known allergies.  Review of Systems Review of Systems Pertinent findings revealed after performing a 14 point review of systems has been noted in the history of present illness.  Physical Exam Vital Signs BP (!) 142/85 (BP Location: Right Arm)   Pulse 79   Temp 98.2 F (36.8 C) (Oral)   Resp 17   SpO2 98%   No data found.  Physical Exam Vitals and nursing note reviewed.  Constitutional:      General: He is not in acute distress.    Appearance: Normal appearance. He is normal weight. He is not ill-appearing.  HENT:     Head: Normocephalic and atraumatic.  Eyes:     Extraocular Movements: Extraocular movements intact.     Conjunctiva/sclera: Conjunctivae normal.     Pupils: Pupils are equal, round, and reactive to light.  Cardiovascular:     Rate and Rhythm: Normal rate and regular rhythm.  Pulmonary:     Effort: Pulmonary effort is  normal.     Breath sounds: Normal breath sounds.  Musculoskeletal:     Cervical back: Normal range of motion and neck supple.     Lumbar back: Spasms and tenderness present. No swelling or bony tenderness. Normal range of motion. Negative right straight leg raise test and negative left straight leg raise test.  Skin:    General: Skin is warm and dry.  Neurological:     General: No focal deficit present.     Mental Status: He is alert and oriented to person, place, and time. Mental status is at baseline.  Psychiatric:        Mood and Affect: Mood normal.        Behavior: Behavior normal.        Thought Content: Thought content normal.        Judgment: Judgment normal.     UC Couse /  Diagnostics / Procedures:     Radiology No results found.  Procedures Procedures (including critical care time) EKG  Pending results:  Labs Reviewed - No data to display  Medications Ordered in UC: Medications - No data to display  UC Diagnoses / Final Clinical Impressions(s)   I have reviewed the triage vital signs and the nursing notes.  Pertinent labs & imaging results that were available during my care of the patient were reviewed by me and considered in my medical decision making (see chart for details).    Final diagnoses:  Spasm of lumbar paraspinous muscle   Patient politely declined offer for x-ray to be ordered at our [place] location due to having no [employer] at this location today.  Patient was advised to: Continue taking ibuprofen 800 mg 3 times daily for the next 5 to 7 days Take muscle relaxer 3 times daily (Patient has been advised that if this makes them sleepy, they can just take this at bedtime, up to 20 mg per dose, and try breaking the tablets in half or 5 mg per dose during the day) Apply ice pack to affected area 4 times daily for 20 minutes each time Consider physical therapy, chiropractic care, orthopedic follow-up Avoid stretching or strengthening exercises until pain is completely resolved Return to urgent care in the next 2 to 3 days for repeat ketorolac injection if needed Return precautions advised  Please see discharge instructions below for details of plan of care as provided to patient. ED Prescriptions     Medication Sig Dispense Auth. Provider   baclofen (LIORESAL) 10 MG tablet Take 1 tablet (10 mg total) by mouth at bedtime for 7 days. 7 tablet Theadora Rama Scales, PA-C      PDMP not reviewed this encounter.    Discharge Instructions      The mainstay of therapy for musculoskeletal pain is reduction of inflammation and relaxation of tension which is causing inflammation.  Keep in mind, pain always begets more pain.  To help  you stay ahead of your pain and inflammation, I have provided the following regimen for you:   This evening, you can begin taking baclofen 10 mg.  This is a highly effective muscle relaxer and antispasmodic which should continue to provide you with relaxation of your tense muscles, allow you to sleep well and to keep your pain under control.  You can continue taking this medication 3 times daily as you need to.  If you find that this medication makes you too sleepy, you can break them in half for your daytime doses and, if needed double  them for your nighttime dose.  Do not take more than 30 mg of baclofen in a 24-hour period.   During the day, please set aside time to apply heat and ice to the affected area at least 4 times daily alternating each for 20 minutes.   Please consider discussing referral to physical therapy with your primary care provider.  Physical therapist are very good at teasing out the underlying cause of acute musculoskeletal pain and helping with prevention of future recurrences.   Please avoid attempts to stretch or strengthen the affected area until you are feeling completely pain-free.  Attempts to do so will only prolong the healing process.   I also recommend that you remain out of work for the next several days, I provided you with a note to return to work in 3 days.  If you feel that you need this time extended, please follow-up with your primary care provider or return to urgent care for reevaluation so that we can provide you with a note for another 3 days.   Thank you for visiting [facility] today.  We appreciate the opportunity to participate in your care.       Disposition Upon Discharge:  Condition: stable for discharge home Home: take medications as prescribed; routine discharge instructions as discussed; follow up as advised.  Patient presented with an acute illness with associated systemic symptoms and significant discomfort requiring urgent  management. In my opinion, this is a condition that a prudent lay person (someone who possesses an average knowledge of health and medicine) may potentially expect to result in complications if not addressed urgently such as respiratory distress, impairment of bodily function or dysfunction of bodily organs.   Routine symptom specific, illness specific and/or disease specific instructions were discussed with the patient and/or caregiver at length.   As such, the patient has been evaluated and assessed, work-up was performed and treatment was provided in alignment with urgent care protocols and evidence based medicine.  Patient/parent/caregiver has been advised that the patient may require follow up for further testing and treatment if the symptoms continue in spite of treatment, as clinically indicated and appropriate.  Patient/parent/caregiver has been advised to report to orthopedic urgent care clinic or return to the [facility] or PCP in 3-5 days if no better; follow-up with orthopedics, PCP or the Emergency Department if new signs and symptoms develop or if the current signs or symptoms continue to change or worsen for further workup, evaluation and treatment as clinically indicated and appropriate  The patient will follow up with their current PCP if and as advised. If the patient does not currently have a PCP we will have assisted them in obtaining one.   The patient may need specialty follow up if the symptoms continue, in spite of conservative treatment and management, for further workup, evaluation, consultation and treatment as clinically indicated and appropriate.  Patient/parent/caregiver verbalized understanding and agreement of plan as discussed.  All questions were addressed during visit.  Please see discharge instructions below for further details of plan.  This office note has been dictated using Teaching laboratory technician.  Unfortunately, this method of dictation can sometimes lead  to typographical or grammatical errors.  I apologize for your inconvenience in advance if this occurs.  Please do not hesitate to reach out to me if clarification is needed.      Theadora Rama Scales, PA-C 12/17/23 1650
